# Patient Record
Sex: Female | Born: 2000 | Race: White | Hispanic: No | Marital: Single | Smoking: Never smoker
Health system: Southern US, Community
[De-identification: ages and names within clinical notes are randomized; demographics above are authoritative.]

---
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
## 2022-09-20 ENCOUNTER — Encounter: Payer: Self-pay | Admitting: Hematology & Oncology

## 2022-09-20 ENCOUNTER — Emergency Department (HOSPITAL_COMMUNITY): Payer: Medicaid Other

## 2022-09-20 ENCOUNTER — Encounter (HOSPITAL_COMMUNITY): Payer: Self-pay

## 2022-09-20 ENCOUNTER — Other Ambulatory Visit: Payer: Self-pay

## 2022-09-20 ENCOUNTER — Emergency Department (HOSPITAL_COMMUNITY)
Admission: EM | Admit: 2022-09-20 | Discharge: 2022-09-20 | Disposition: A | Discharge status: Home / Self Care | Payer: Medicaid Other | Attending: Emergency Medicine | Admitting: Emergency Medicine

## 2022-09-20 DIAGNOSIS — R519 Headache, unspecified: Secondary | ICD-10-CM | POA: Diagnosis not present

## 2022-09-20 DIAGNOSIS — Y92410 Unspecified street and highway as the place of occurrence of the external cause: Secondary | ICD-10-CM | POA: Insufficient documentation

## 2022-09-20 DIAGNOSIS — R1030 Lower abdominal pain, unspecified: Secondary | ICD-10-CM | POA: Diagnosis present

## 2022-09-20 LAB — I-STAT CHEM 8, ED
BUN: 10 mg/dL (ref 6–20)
Calcium, Ion: 1.28 mmol/L (ref 1.15–1.40)
Chloride: 107 mmol/L (ref 98–111)
Creatinine, Ser: 0.7 mg/dL (ref 0.44–1.00)
Glucose, Bld: 91 mg/dL (ref 70–99)
HCT: 40 % (ref 36.0–46.0)
Hemoglobin: 13.6 g/dL (ref 12.0–15.0)
Potassium: 3.6 mmol/L (ref 3.5–5.1)
Sodium: 143 mmol/L (ref 135–145)
TCO2: 25 mmol/L (ref 22–32)

## 2022-09-20 LAB — I-STAT BETA HCG BLOOD, ED (MC, WL, AP ONLY): I-stat hCG, quantitative: <5 m[IU]/mL (ref <5)

## 2022-09-20 MED ORDER — METHOCARBAMOL 500 MG PO TABS: 500.0000 mg | ORAL_TABLET | Freq: Two times a day (BID) | ORAL | 0 refills | Status: AC

## 2022-09-20 MED ORDER — NAPROXEN 375 MG PO TABS: 375.0000 mg | ORAL_TABLET | Freq: Two times a day (BID) | ORAL | 0 refills | Status: AC

## 2022-09-20 MED ORDER — LIDOCAINE 5 % EX PTCH: 1.0000 | MEDICATED_PATCH | CUTANEOUS | 0 refills | Status: AC

## 2022-09-20 MED ORDER — ACETAMINOPHEN 500 MG PO TABS
1000.0000 mg | ORAL_TABLET | Freq: Once | ORAL | Status: AC
  Administered 2022-09-20: 1000 mg via ORAL
  Filled 2022-09-20: qty 2

## 2022-09-20 MED ORDER — IOHEXOL 300 MG/ML  SOLN
100.0000 mL | Freq: Once | INTRAMUSCULAR | Status: AC | PRN
  Administered 2022-09-20: 100 mL via INTRAVENOUS
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
## 2022-09-21 ENCOUNTER — Encounter: Payer: Self-pay | Admitting: Hematology & Oncology

## 2023-03-25 ENCOUNTER — Encounter: Payer: Self-pay | Admitting: Internal Medicine

## 2023-03-25 ENCOUNTER — Ambulatory Visit: Payer: Medicaid Other | Attending: Internal Medicine | Admitting: Internal Medicine

## 2023-03-25 ENCOUNTER — Encounter: Payer: Self-pay | Admitting: Hematology & Oncology

## 2023-03-25 VITALS — BP 119/79 | HR 71 | Resp 14 | Ht 65.0 in | Wt 163.0 lb

## 2023-03-25 DIAGNOSIS — M25562 Pain in left knee: Secondary | ICD-10-CM

## 2023-03-25 DIAGNOSIS — R768 Other specified abnormal immunological findings in serum: Secondary | ICD-10-CM | POA: Diagnosis not present

## 2023-03-25 DIAGNOSIS — M25561 Pain in right knee: Secondary | ICD-10-CM

## 2023-03-25 DIAGNOSIS — M2550 Pain in unspecified joint: Secondary | ICD-10-CM | POA: Insufficient documentation

## 2023-03-25 NOTE — Progress Notes (Signed)
Office Visit Note  Patient: Lorraine Knight             Date of Birth: November 29, 2000           MRN: [id number]             PCP: Patient, No Pcp Per Referring: Maris Berger, MD Visit Date: 03/25/2023   Subjective:  New Patient (Initial Visit) (Patient states she has severe dandruff and dry skin. Patient states sometimes she has an intense pain in her right rib cage area. Patient states she does have lesions that pop up. Patient states her ears ring and click. Patient states she has joint pain in her knees, ankles, jaw, and wrists. Patient states her joints pop a lot. )   History of Present Illness: Lorraine Knight is a 22 y.o. female here for evaluation of positive ANA and RNP antibodies checked associated with chronic joint pain and stiffness of multiple areas and skin rashes.  Besides onset of symptoms was mostly gradual.  Really noticed some since about 4-1/2 years ago starting around time of undergraduate freshman year.  She experienced rashes with extensive dandruff and flaking from the scalp also with chronic red facial rash.  She gets splotchy and red breaking out occurring on the face arms or other sun exposed areas this will last for hours before resolving.  Does not notice a similar reaction to just getting overheated without sun exposure.  Does not have any residual discoloration.  She does not see change of color in fingers or toes with temperature change.  Prescribed a dandruff shampoo has been helping symptoms. Joint pain is also ongoing problem worst is affecting her knees.  Notices pain in bilateral knees and feet that gets worse with prolonged standing and walking.  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  Since finishing schooling and getting a job with a driving commute rather than walking leg pain has not totally resolved now also has more pain in her neck and upper back and shoulders.   Takes Tylenol and ibuprofen occasionally as needed but nothing on most days as she does not like to take medicines. Previous history of stomach pain apparently diagnosed with an ulcer.  This was treated with PPI medication with good resolution.  Gets occasional ulcers or sores in the mouth not consistently.  Does not experience irritable bowel symptoms of diarrhea or constipation.  10/2022 ANA pos RNP 1.9  Activities of Daily Living:  Patient reports morning stiffness for 1 hour.   Patient Reports nocturnal pain.  Difficulty dressing/grooming: Denies Difficulty climbing stairs: Reports Difficulty getting out of chair: Reports Difficulty using hands for taps, buttons, cutlery, and/or writing: Denies  Review of Systems  Constitutional:  Positive for fatigue.  HENT:  Positive for mouth sores and mouth dryness.   Eyes:  Positive for dryness.  Respiratory:  Negative for shortness of breath.   Cardiovascular:  Positive for chest pain and palpitations.  Gastrointestinal:  Positive for constipation. Negative for blood in stool and diarrhea.  Endocrine: Negative for increased urination.  Genitourinary:  Negative for involuntary urination.  Musculoskeletal:  Positive for joint pain, gait problem, joint pain, joint swelling, myalgias, muscle weakness, morning stiffness, muscle tenderness and myalgias.  Skin:  Positive for color change, hair loss and sensitivity to sunlight. Negative for rash.  Allergic/Immunologic: Positive for susceptible to infections.  Neurological:  Positive for dizziness and headaches.  Hematological:  Negative for swollen glands.  Psychiatric/Behavioral:  Positive for depressed mood and sleep disturbance. The patient is nervous/anxious.     PMFS History:  Patient Active Problem List   Diagnosis Date Noted   Arthralgia 03/25/2023   Positive ANA (antinuclear antibody) 03/25/2023   Iron deficiency anemia due to chronic blood loss 05/10/2019   Menometrorrhagia 05/10/2019    Easy bruising 05/10/2019   Goals of care, counseling/discussion 05/10/2019    Past Medical History:  Diagnosis Date   Easy bruising 05/10/2019   Goals of care, counseling/discussion 05/10/2019   Iron deficiency anemia due to chronic blood loss 05/10/2019   Menometrorrhagia 05/10/2019    Family History  Problem Relation Age of Onset   Other Mother        Margaretha Sheffield   Hypertension Mother    Bipolar disorder Father    History reviewed. No pertinent surgical history. Social History   Social History Narrative   Not on file    There is no immunization history on file for this patient.   Objective: Vital Signs: BP 119/79 (BP Location: Right Arm, Patient Position: Sitting, Cuff Size: Normal)   Pulse 71   Resp 14   Ht 5\' 5"  (1.651 m)   Wt 163 lb (73.9 kg)   LMP 03/09/2023   BMI 27.12 kg/m    Physical Exam Eyes:     Conjunctiva/sclera: Conjunctivae normal.  Cardiovascular:     Rate and Rhythm: Normal rate and regular rhythm.  Pulmonary:     Effort: Pulmonary effort is normal.     Breath sounds: Normal breath sounds.  Lymphadenopathy:     Cervical: No cervical adenopathy.  Skin:    General: Skin is warm and dry.  Neurological:     Mental Status: She is alert.  Psychiatric:        Mood and Affect: Mood normal.      Musculoskeletal Exam:  Neck full ROM paraspinal muscle tenderness more at base and extending over trapezius and rhomboid areas Shoulders full ROM no tenderness or swelling Elbows full ROM no tenderness or swelling Wrists full ROM no tenderness or swelling Fingers full ROM no tenderness or swelling Knees full ROM no tenderness or swelling Ankles full ROM no tenderness or swelling   Investigation: No additional findings.  Imaging: No results found.  Recent Labs: Lab Results  Component Value Date   WBC 5.6 05/10/2019   HGB 13.6 09/20/2022   PLT 235 05/10/2019   NA 143 09/20/2022   K 3.6 09/20/2022   CL 107 09/20/2022   CO2 26 05/10/2019    GLUCOSE 91 09/20/2022   BUN 10 09/20/2022   CREATININE 0.70 09/20/2022   BILITOT 0.3 05/10/2019   ALKPHOS 45 05/10/2019   AST 11 (L) 05/10/2019   ALT 10 05/10/2019   PROT 6.5 05/10/2019   ALBUMIN 4.3 05/10/2019   CALCIUM 9.0 05/10/2019   GFRAA >60 05/10/2019    Speciality Comments: No specialty comments available.  Procedures:  No procedures performed Allergies: Benadryl [diphenhydramine], Diflucan [fluconazole], and Singulair [montelukast sodium]   Assessment / Plan:     Visit Diagnoses: Positive ANA (antinuclear antibody) - Plan: C3 and C4, Sedimentation rate, C-reactive protein, ANA, RNP Antibody  Checking sed rate CRP and complements for assessment of inflammatory activity.  Face could be consistent with malar rash although not definitive from symptoms today.  Does not meet clinical criteria, could be early presentation or undifferentiated CTD.  Will also recheck the RNP antibody titer and check ANA with  titer and pattern.  Low threshold to recommend trial of low-dose hydroxychloroquine to see response and arthralgias and possible photosensitive rash.  Arthralgia of both knees  Do not see any major red flags for inflammatory arthritis at this time.  Checking workup as above.  There is no appreciable swelling crepitus or abnormal range of motion.  If lab results unremarkable but symptoms get worse could consider physical therapy assessment.  Orders: Orders Placed This Encounter  Procedures   C3 and C4   Sedimentation rate   C-reactive protein   ANA   RNP Antibody   No orders of the defined types were placed in this encounter.    Follow-Up Instructions: Return in about 2 months (around 05/25/2023) for New pt +ANA ?HCQ start f/u 2mos.   Fuller Plan, MD  Note - This record has been created using [employer].  Chart creation errors have been sought, but may not always  have been located. Such creation errors do not reflect on  the standard of medical  care.

## 2023-03-28 LAB — C-REACTIVE PROTEIN: CRP: 6.4 mg/L (ref <8.0)

## 2023-03-28 LAB — SEDIMENTATION RATE: Sed Rate: 14 mm/h (ref 0–20)

## 2023-03-28 LAB — RNP ANTIBODY: Ribonucleic Protein(ENA) Antibody, IgG: 1.6 AI — AB

## 2023-03-28 LAB — ANA: Anti Nuclear Antibody (ANA): NEGATIVE

## 2023-03-28 LAB — C3 AND C4
C3 Complement: 150 mg/dL (ref 83–193)
C4 Complement: 20 mg/dL (ref 15–57)

## 2023-04-11 ENCOUNTER — Encounter: Payer: Self-pay | Admitting: Internal Medicine

## 2023-04-15 ENCOUNTER — Telehealth: Payer: Self-pay | Admitting: *Deleted

## 2023-04-15 NOTE — Telephone Encounter (Signed)
Patient contacted the office regarding her lab results. Please review and advise.

## 2023-04-18 NOTE — Telephone Encounter (Signed)
See results note for details.

## 2023-04-18 NOTE — Telephone Encounter (Signed)
Now addressed in result note

## 2023-04-18 NOTE — Progress Notes (Signed)
RNP of 1.6 decreased compared to 1.9 before, and the ANA test is now negative. This suggests the previous results may have been a reaction or temporary and not likely to indicate a chronic disease. If knee pain remains a major problem we can refer her to see physical therapy. If rashes are getting any worse we could refer to dermatology. I don't think we need to keep a scheduled follow up at this time.

## 2023-05-13 ENCOUNTER — Encounter: Payer: Self-pay | Admitting: Internal Medicine

## 2023-05-13 ENCOUNTER — Ambulatory Visit: Payer: Medicaid Other | Attending: Internal Medicine | Admitting: Internal Medicine

## 2023-05-13 VITALS — BP 112/73 | HR 80 | Resp 12 | Ht 64.0 in | Wt 176.0 lb

## 2023-05-13 DIAGNOSIS — M7652 Patellar tendinitis, left knee: Secondary | ICD-10-CM

## 2023-05-13 DIAGNOSIS — M7651 Patellar tendinitis, right knee: Secondary | ICD-10-CM | POA: Insufficient documentation

## 2023-05-13 NOTE — Progress Notes (Signed)
Office Visit Note  Patient: Lorraine Knight             Date of Birth: 05-12-2001           MRN: [id number]             PCP: Maris Berger, MD Referring: No ref. provider found Visit Date: 05/13/2023   Subjective:  Follow-up   Discussed the use of AI scribe software for clinical note transcription with the patient, who gave verbal consent to proceed.  History of Present Illness   Lorraine Knight is a 22 y.o. female here for follow up with a history of positive RNP antibody marker, presents with persistent joint pain and recurrent canker sores. The joint pain is particularly debilitating, causing exhaustion and significant discomfort in the knees after routine activities such as cooking and doing dishes. The patient denies taking any medication for this issue. On examination, the pain was localized to the patellar tendon, with no apparent fluid collection, tears, or bursitis in the knee joint.  The patient also reports improvement in previously discussed shoulder muscle problems, attributed to the implementation of recommended stretches. However, they have noticed a grinding sensation in the back and shoulder area during movement, particularly during exercises. The patient also mentions a possible loss of hair in patches on the leg, but no other associated skin changes were reported.  The patient has been trying to avoid activities that provoke joint pain and has noticed discomfort in the wrists during attempts to do planks. They also report a hyperextension of the knees when standing, which was noticed by a family member.    Previous HPI 03/25/23 Lorraine Knight is a 22 y.o. female here for evaluation of positive ANA and RNP antibodies checked associated with chronic joint pain and stiffness of multiple areas and skin rashes.  Besides onset of symptoms was mostly gradual.  Really noticed some since about 4-1/2 years ago starting around time of undergraduate freshman year.  She experienced  rashes with extensive dandruff and flaking from the scalp also with chronic red facial rash.  She gets splotchy and red breaking out occurring on the face arms or other sun exposed areas this will last for hours before resolving.  Does not notice a similar reaction to just getting overheated without sun exposure.  Does not have any residual discoloration.  She does not see change of color in fingers or toes with temperature change.  Prescribed a dandruff shampoo has been helping symptoms. Joint pain is also ongoing problem worst is affecting her knees.  Notices pain in bilateral knees and feet that gets worse with prolonged standing and walking.  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  Since finishing schooling and getting a job with a driving commute rather than walking leg pain has not totally resolved now also has more pain in her neck and upper back and shoulders.  Takes Tylenol and ibuprofen occasionally as needed but nothing on most days as she does not like to take medicines. Previous history of stomach pain apparently diagnosed with an ulcer.  This was treated with PPI medication with good resolution.  Gets occasional ulcers or sores in the mouth not consistently.  Does not experience irritable bowel symptoms of diarrhea or constipation.   10/2022 ANA pos RNP 1.9   Review of Systems  Constitutional:  Positive for fatigue.  HENT:  Positive for mouth sores  and mouth dryness.   Eyes:  Positive for dryness.  Respiratory:  Negative for shortness of breath.   Cardiovascular:  Negative for chest pain and palpitations.  Gastrointestinal:  Positive for constipation and diarrhea. Negative for blood in stool.  Endocrine: Negative for increased urination.  Genitourinary:  Negative for involuntary urination.  Musculoskeletal:  Positive for joint pain, gait problem, joint pain, joint swelling, muscle weakness,  morning stiffness and muscle tenderness. Negative for myalgias and myalgias.  Skin:  Positive for color change, hair loss and sensitivity to sunlight. Negative for rash.  Allergic/Immunologic: Positive for susceptible to infections.  Neurological:  Positive for dizziness and headaches.  Hematological:  Negative for swollen glands.  Psychiatric/Behavioral:  Negative for depressed mood and sleep disturbance. The patient is nervous/anxious.     PMFS History:  Patient Active Problem List   Diagnosis Date Noted   Patellar tendonitis of both knees 05/13/2023   Arthralgia 03/25/2023   Positive ANA (antinuclear antibody) 03/25/2023   Iron deficiency anemia due to chronic blood loss 05/10/2019   Menometrorrhagia 05/10/2019   Easy bruising 05/10/2019   Goals of care, counseling/discussion 05/10/2019    Past Medical History:  Diagnosis Date   Easy bruising 05/10/2019   Goals of care, counseling/discussion 05/10/2019   Iron deficiency anemia due to chronic blood loss 05/10/2019   Menometrorrhagia 05/10/2019    Family History  Problem Relation Age of Onset   Other Mother        Margaretha Sheffield   Hypertension Mother    Bipolar disorder Father    History reviewed. No pertinent surgical history. Social History   Social History Narrative   Not on file    There is no immunization history on file for this patient.   Objective: Vital Signs: BP 112/73 (BP Location: Left Arm, Patient Position: Sitting, Cuff Size: Normal)   Pulse 80   Resp 12   Ht 5\' 4"  (1.626 m)   Wt 176 lb (79.8 kg)   LMP 05/02/2023   BMI 30.21 kg/m    Physical Exam Cardiovascular:     Rate and Rhythm: Normal rate and regular rhythm.  Pulmonary:     Effort: Pulmonary effort is normal.     Breath sounds: Normal breath sounds.  Musculoskeletal:     Right lower leg: No edema.     Left lower leg: No edema.  Skin:    General: Skin is warm and dry.     Findings: No rash.  Neurological:     Mental Status: She is  alert.  Psychiatric:        Mood and Affect: Mood normal.      Musculoskeletal Exam:  Neck b/l paraspinal muscle tenderness more at base and extending over trapezius and rhomboid areas  Shoulders full ROM, popping with overhead movement, no focal tenderness Elbows full ROM no tenderness or swelling Wrists full ROM no tenderness or swelling Fingers full ROM no tenderness or swelling Knees full ROM tenderness to pressure at patellar tendon b/l, no effusions Ankles full ROM no tenderness or swelling  Investigation: No additional findings.  Imaging: No results found.  Recent Labs: Lab Results  Component Value Date   WBC 5.6 05/10/2019   HGB 13.6 09/20/2022   PLT 235 05/10/2019   NA 143 09/20/2022   K 3.6 09/20/2022   CL 107 09/20/2022   CO2 26 05/10/2019   GLUCOSE 91 09/20/2022   BUN 10 09/20/2022   CREATININE 0.70 09/20/2022   BILITOT 0.3 05/10/2019   ALKPHOS 45  05/10/2019   AST 11 (L) 05/10/2019   ALT 10 05/10/2019   PROT 6.5 05/10/2019   ALBUMIN 4.3 05/10/2019   CALCIUM 9.0 05/10/2019   GFRAA >60 05/10/2019    Speciality Comments: No specialty comments available.  Procedures:  No procedures performed Allergies: Benadryl [diphenhydramine], Diflucan [fluconazole], and Singulair [montelukast sodium]   Assessment / Plan:     Visit Diagnoses: Patellar tendonitis of both knees Joint Pain (Knees) Chronic knee pain, particularly in the patellar tendon region. No evidence of fluid collection, tears, or bursitis on ultrasound. Possible patellar tendonitis. -May be simple tendonitis problem, maybe hypermobility related risk factor. Will reach out to sports medicine if they have recommendation for this or otherwise get PT  Shoulder Pain Reports of grinding sensation in shoulder during certain movements, possibly due to limited acromio-humeral distance. No sharp pain reported. -Modify range of motion during exercises to avoid provoking the grinding sensation. -Consider  evaluation by a specialist if symptoms persist or worsen.  Positive RNP Antibody Marker Slightly positive RNP antibody marker, decreased from previous level. All other labs normal. -No specific plan for additional workup needed  Orders: No orders of the defined types were placed in this encounter.  No orders of the defined types were placed in this encounter.    Follow-Up Instructions: Return if symptoms worsen or fail to improve.   Fuller Plan, MD  Note - This record has been created using [employer].  Chart creation errors have been sought, but may not always  have been located. Such creation errors do not reflect on  the standard of medical care.
